# Patient Record
Sex: Male | Born: 2000 | Race: Black or African American | Hispanic: No | Marital: Single | Smoking: Never smoker
Health system: Southern US, Community
[De-identification: ages and names within clinical notes are randomized; demographics above are authoritative.]

---
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
## 2023-05-30 ENCOUNTER — Ambulatory Visit
Admission: EM | Admit: 2023-05-30 | Discharge: 2023-05-30 | Disposition: A | Discharge status: Home / Self Care | Payer: No Typology Code available for payment source | Attending: Emergency Medicine | Admitting: Emergency Medicine

## 2023-05-30 DIAGNOSIS — M25562 Pain in left knee: Secondary | ICD-10-CM | POA: Diagnosis not present

## 2023-05-30 MED ORDER — IBUPROFEN 800 MG PO TABS: 800.0000 mg | ORAL_TABLET | Freq: Three times a day (TID) | ORAL | 0 refills | Status: AC

## 2023-05-30 MED ORDER — CYCLOBENZAPRINE HCL 10 MG PO TABS: 10.0000 mg | ORAL_TABLET | Freq: Two times a day (BID) | ORAL | 0 refills | Status: AC | PRN
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
## 2023-08-09 ENCOUNTER — Ambulatory Visit
Admission: EM | Admit: 2023-08-09 | Discharge: 2023-08-09 | Disposition: A | Discharge status: Home / Self Care | Attending: Family Medicine | Admitting: Family Medicine

## 2023-08-09 DIAGNOSIS — Z113 Encounter for screening for infections with a predominantly sexual mode of transmission: Secondary | ICD-10-CM | POA: Diagnosis not present

## 2023-08-09 DIAGNOSIS — Z20828 Contact with and (suspected) exposure to other viral communicable diseases: Secondary | ICD-10-CM | POA: Insufficient documentation

## 2023-08-09 MED ORDER — VALACYCLOVIR HCL 1 G PO TABS: 1000.0000 mg | ORAL_TABLET | Freq: Three times a day (TID) | ORAL | 0 refills | Status: AC

## 2023-08-09 NOTE — Discharge Instructions (Addendum)
 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 We will let you know about your test results and get you treated for any positive results.

## 2023-08-09 NOTE — ED Triage Notes (Signed)
 Pt requesting STD testing-states his girlfriend has +herpes testing yesterday-pt denies any sores-NAD-steady gait

## 2023-08-09 NOTE — ED Provider Notes (Signed)
 [facility]  Note:  This document was prepared using Conservation officer, historic buildings and may include unintentional dictation errors.  MRN: [id number] DOB: Apr 23, 2001  Subjective:   Drew Powell is a 23 y.o. male presenting for STI testing.  Patient reports that his girlfriend was seen yesterday and had a blisterlike rash of the right medial thigh.  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  Patient would like to get tested.  Denies any rashes of any kind, dysuria, hematuria, urinary frequency, penile discharge, penile swelling, testicular pain, testicular swelling, anal pain, groin pain.   No current facility-administered medications for this encounter.  Current Outpatient Medications:    cyclobenzaprine (FLEXERIL) 10 MG tablet, Take 1 tablet (10 mg total) by mouth 2 (two) times daily as needed for muscle spasms., Disp: 20 tablet, Rfl: 0   ibuprofen (ADVIL) 800 MG tablet, Take 1 tablet (800 mg total) by mouth 3 (three) times daily., Disp: 21 tablet, Rfl: 0   No Known Allergies  History reviewed. No pertinent past medical history.   History reviewed. No pertinent surgical history.  No family history on file.  Social History   Tobacco Use   Smoking status: Never   Smokeless tobacco: Never  Vaping Use   Vaping status: Never Used  Substance Use Topics   Alcohol use: Yes    Comment: occ   Drug use: Yes    Types: Marijuana    ROS   Objective:   Vitals: BP 118/74 (BP Location: Left Arm)   Pulse 70   Temp (!) 97.4 F (36.3 C) (Oral)   Resp 17   SpO2 100%   Physical Exam Constitutional:      General: He is not in acute distress.    Appearance: Normal appearance. He is well-developed and normal weight. He is not ill-appearing, toxic-appearing or diaphoretic.  HENT:     Head: Normocephalic and atraumatic.     Right Ear: External ear normal.     Left Ear: External ear normal.     Nose: Nose normal.     Mouth/Throat:      Pharynx: Oropharynx is clear.  Eyes:     General: No scleral icterus.       Right eye: No discharge.        Left eye: No discharge.     Extraocular Movements: Extraocular movements intact.  Cardiovascular:     Rate and Rhythm: Normal rate.  Pulmonary:     Effort: Pulmonary effort is normal.  Genitourinary:    Penis: Circumcised. No phimosis, paraphimosis, hypospadias, erythema, tenderness, discharge, swelling or lesions.   Musculoskeletal:     Cervical back: Normal range of motion.  Neurological:     Mental Status: He is alert and oriented to person, place, and time.  Psychiatric:        Mood and Affect: Mood normal.        Behavior: Behavior normal.        Thought Content: Thought content normal.        Judgment: Judgment normal.     Assessment and Plan :   PDMP not reviewed this encounter.  1. Screen for STD (sexually transmitted disease)   2. Exposure to herpes simplex virus (HSV)    There is no rash for me to swab and therefore deferred HSV viral culture.  Recommended valacyclovir as prophylaxis.  STI testing, will treat as appropriate otherwise.  Counseled patient on potential for adverse effects with medications prescribed/recommended today,  ER and return-to-clinic precautions discussed, patient verbalized understanding.    Wallis Bamberg, [place] 08/09/23 [phone]

## 2023-08-10 LAB — CYTOLOGY, (ORAL, ANAL, URETHRAL) ANCILLARY ONLY
Chlamydia: NEGATIVE
Comment: NEGATIVE
Comment: NEGATIVE
Comment: NORMAL
Neisseria Gonorrhea: NEGATIVE
Trichomonas: NEGATIVE

## 2023-08-10 LAB — HIV ANTIBODY (ROUTINE TESTING W REFLEX): HIV Screen 4th Generation wRfx: NONREACTIVE

## 2023-08-10 LAB — RPR: RPR Ser Ql: NONREACTIVE
